# Patient Record
Sex: Male | Born: 2000 | Marital: Single | Smoking: Never smoker
Health system: Southern US, Community
[De-identification: ages and names within clinical notes are randomized; demographics above are authoritative.]

---
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
## 2020-08-25 ENCOUNTER — Other Ambulatory Visit: Payer: Self-pay

## 2020-08-25 ENCOUNTER — Encounter (HOSPITAL_COMMUNITY): Payer: Self-pay

## 2020-08-25 ENCOUNTER — Emergency Department (HOSPITAL_COMMUNITY): Payer: Medicaid Other

## 2020-08-25 ENCOUNTER — Emergency Department (HOSPITAL_COMMUNITY)
Admission: EM | Admit: 2020-08-25 | Discharge: 2020-08-25 | Disposition: A | Discharge status: Home / Self Care | Payer: Medicaid Other | Attending: Emergency Medicine | Admitting: Emergency Medicine

## 2020-08-25 DIAGNOSIS — W01198A Fall on same level from slipping, tripping and stumbling with subsequent striking against other object, initial encounter: Secondary | ICD-10-CM | POA: Diagnosis not present

## 2020-08-25 DIAGNOSIS — R000 Tachycardia, unspecified: Secondary | ICD-10-CM | POA: Diagnosis not present

## 2020-08-25 DIAGNOSIS — Y92838 Other recreation area as the place of occurrence of the external cause: Secondary | ICD-10-CM | POA: Insufficient documentation

## 2020-08-25 DIAGNOSIS — R55 Syncope and collapse: Secondary | ICD-10-CM | POA: Insufficient documentation

## 2020-08-25 DIAGNOSIS — N3000 Acute cystitis without hematuria: Secondary | ICD-10-CM | POA: Insufficient documentation

## 2020-08-25 DIAGNOSIS — S0992XA Unspecified injury of nose, initial encounter: Secondary | ICD-10-CM | POA: Diagnosis present

## 2020-08-25 DIAGNOSIS — S022XXA Fracture of nasal bones, initial encounter for closed fracture: Secondary | ICD-10-CM | POA: Insufficient documentation

## 2020-08-25 LAB — CBC WITH DIFFERENTIAL/PLATELET
Abs Immature Granulocytes: 0.04 10*3/uL (ref 0.00–0.07)
Basophils Absolute: 0.1 10*3/uL (ref 0.0–0.1)
Basophils Relative: 0 %
Eosinophils Absolute: 0 10*3/uL (ref 0.0–0.5)
Eosinophils Relative: 0 %
HCT: 44.1 % (ref 39.0–52.0)
Hemoglobin: 14.9 g/dL (ref 13.0–17.0)
Immature Granulocytes: 0 %
Lymphocytes Relative: 16 %
Lymphs Abs: 2 10*3/uL (ref 0.7–4.0)
MCH: 26.6 pg (ref 26.0–34.0)
MCHC: 33.8 g/dL (ref 30.0–36.0)
MCV: 78.8 fL — ABNORMAL LOW (ref 80.0–100.0)
Monocytes Absolute: 0.5 10*3/uL (ref 0.1–1.0)
Monocytes Relative: 4 %
Neutro Abs: 10 10*3/uL — ABNORMAL HIGH (ref 1.7–7.7)
Neutrophils Relative %: 80 %
Platelets: 367 10*3/uL (ref 150–400)
RBC: 5.6 MIL/uL (ref 4.22–5.81)
RDW: 12.3 % (ref 11.5–15.5)
WBC: 12.6 10*3/uL — ABNORMAL HIGH (ref 4.0–10.5)
nRBC: 0 % (ref 0.0–0.2)

## 2020-08-25 LAB — URINALYSIS, ROUTINE W REFLEX MICROSCOPIC
Bacteria, UA: NONE SEEN
Glucose, UA: NEGATIVE mg/dL
Hgb urine dipstick: NEGATIVE
Ketones, ur: 5 mg/dL — AB
Nitrite: NEGATIVE
Protein, ur: 100 mg/dL — AB
Specific Gravity, Urine: 1.028 (ref 1.005–1.030)
WBC, UA: >50 WBC/hpf — ABNORMAL HIGH (ref 0–5)
pH: 5 (ref 5.0–8.0)

## 2020-08-25 LAB — COMPREHENSIVE METABOLIC PANEL
ALT: 36 U/L (ref 0–44)
AST: 25 U/L (ref 15–41)
Albumin: 4.5 g/dL (ref 3.5–5.0)
Alkaline Phosphatase: 90 U/L (ref 38–126)
Anion gap: 13 (ref 5–15)
BUN: 10 mg/dL (ref 6–20)
CO2: 22 mmol/L (ref 22–32)
Calcium: 9.9 mg/dL (ref 8.9–10.3)
Chloride: 111 mmol/L (ref 98–111)
Creatinine, Ser: 0.96 mg/dL (ref 0.61–1.24)
GFR, Estimated: >60 mL/min (ref >60)
Glucose, Bld: 98 mg/dL (ref 70–99)
Potassium: 3.8 mmol/L (ref 3.5–5.1)
Sodium: 146 mmol/L — ABNORMAL HIGH (ref 135–145)
Total Bilirubin: 0.6 mg/dL (ref 0.3–1.2)
Total Protein: 8.4 g/dL — ABNORMAL HIGH (ref 6.5–8.1)

## 2020-08-25 MED ORDER — ACETAMINOPHEN 325 MG PO TABS
650.0000 mg | ORAL_TABLET | Freq: Once | ORAL | Status: AC
  Administered 2020-08-25: 650 mg via ORAL
  Filled 2020-08-25: qty 2

## 2020-08-25 MED ORDER — SODIUM CHLORIDE 0.9 % IV BOLUS
1000.0000 mL | Freq: Once | INTRAVENOUS | Status: AC
  Administered 2020-08-25: 1000 mL via INTRAVENOUS

## 2020-08-25 MED ORDER — CEPHALEXIN 500 MG PO CAPS: 500.0000 mg | ORAL_CAPSULE | Freq: Three times a day (TID) | ORAL | 0 refills | Status: AC

## 2020-08-25 NOTE — ED Triage Notes (Signed)
Syncope while on stage at event fell onto face , LOC for 1-2 minutes hx of vertigo

## 2020-08-25 NOTE — ED Provider Notes (Signed)
[facility] EMERGENCY DEPARTMENT Provider Note  CSN: [id number] Arrival date & time: 08/25/20 1901    History Chief Complaint  Patient presents with  . Loss of Consciousness    HPI  Ian Briggs is a 20 y.o. 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 He has had a sinus infection recently and thought it may have been some vertigo initially but then he started to feel lightheaded and started to find a chair to sit down when he passed out. He struck his face on the stage and is now complaining of moderate aching nose pain. He woke up after a few seconds. Denies any antecedent chest pain, palpitations or SOB. He has had a low grade fever the last few days but otherwise no cough, congestion, N/V/D, has been eating and drinking well.    History reviewed. No pertinent past medical history.  History reviewed. No pertinent surgical history.  History reviewed. No pertinent family history.  Social History   Tobacco Use  . Smoking status: Never Smoker  . Smokeless tobacco: Never Used     Home Medications Prior to Admission medications   Medication Sig Start Date End Date Taking? Authorizing Provider  cephALEXin (KEFLEX) 500 MG capsule Take 1 capsule (500 mg total) by mouth 3 (three) times daily for 7 days. 08/25/20 09/01/20 Yes Pollyann Savoy, MD  guaiFENesin (MUCINEX) 600 MG 12 hr tablet Take 600 mg by mouth 2 (two) times daily.   Yes [provider]  Multiple Vitamins-Minerals (EMERGEN-C IMMUNE) PACK Take 1 packet by mouth daily as needed (cold).   Yes [provider]     Allergies    Other   Review of Systems   Review of Systems A comprehensive review of systems was completed and negative except as noted in HPI.    Physical Exam BP (!) 150/87   Pulse 91   Temp 98.6 F (37 C) (Oral)   Resp 20   Ht 6\' 7"  (2.007 m)   Wt (!) 172.4 kg   SpO2 99%   BMI 42.81 kg/m   Physical Exam Vitals and nursing note reviewed.   Constitutional:      Appearance: Normal appearance.  HENT:     Head: Normocephalic.     Nose:     Comments: Nose is tender and swollen, there is some dried blood in L nare and small clot in R nare without active bleeding    Mouth/Throat:     Mouth: Mucous membranes are moist.     Comments: No dental fracture Eyes:     Extraocular Movements: Extraocular movements intact.     Conjunctiva/sclera: Conjunctivae normal.  Cardiovascular:     Rate and Rhythm: Regular rhythm. Tachycardia present.  Pulmonary:     Effort: Pulmonary effort is normal.     Breath sounds: Normal breath sounds.  Abdominal:     General: Abdomen is flat.     Palpations: Abdomen is soft.     Tenderness: There is no abdominal tenderness.  Musculoskeletal:        General: No swelling. Normal range of motion.     Cervical back: Neck supple. No rigidity or tenderness.  Skin:    General: Skin is warm and dry.  Neurological:     General: No focal deficit present.     Mental Status: He is alert.     Cranial Nerves: No cranial nerve deficit.     Sensory: No sensory deficit.  Motor: No weakness.  Psychiatric:        Mood and Affect: Mood normal.      ED Results / Procedures / Treatments   Labs (all labs ordered are listed, but only abnormal results are displayed) Labs Reviewed  COMPREHENSIVE METABOLIC PANEL - Abnormal; Notable for the following components:      Result Value   Sodium 146 (*)    Total Protein 8.4 (*)    All other components within normal limits  CBC WITH DIFFERENTIAL/PLATELET - Abnormal; Notable for the following components:   WBC 12.6 (*)    MCV 78.8 (*)    Neutro Abs 10.0 (*)    All other components within normal limits  URINALYSIS, ROUTINE W REFLEX MICROSCOPIC - Abnormal; Notable for the following components:   APPearance HAZY (*)    Bilirubin Urine SMALL (*)    Ketones, ur 5 (*)    Protein, ur 100 (*)    Leukocytes,Ua MODERATE (*)    WBC, UA >50 (*)    All other components  within normal limits  URINE CULTURE    EKG EKG Interpretation  Date/Time:  Friday Aug 25 2020 19:24:48 EDT Ventricular Rate:  99 PR Interval:  156 QRS Duration: 87 QT Interval:  324 QTC Calculation: 416 R Axis:   57 Text Interpretation: Sinus rhythm Borderline repolarization abnormality No old tracing to compare Confirmed by Susy Frizzle [phone]) on 08/25/2020 7:28:53 PM   Radiology CT Head Wo Contrast  Result Date: 08/25/2020 CLINICAL DATA:  Syncopal episode. Fell on face. Pain and nasal area. EXAM: CT HEAD WITHOUT CONTRAST TECHNIQUE: Contiguous axial images were obtained from the base of the skull through the vertex without intravenous contrast. COMPARISON:  None. FINDINGS: Brain: No acute infarct, hemorrhage, or mass lesion is present. The ventricles are of normal size. No significant extraaxial fluid collection is present. Vascular: No hyperdense vessel or unexpected calcification. Skull: Calvarium is intact. No focal lytic or blastic lesions are present. Minimally displaced nasal fracture present. Soft tissue injury noted. No other significant soft tissue injury present. Sinuses/Orbits: Diffuse sinus disease present. Fluid level present in the left maxillary sinus. Fluid in the posterior nasal cavity may represent blood. IMPRESSION: 1. Minimally displaced nasal fracture with associated soft tissue injury. 2. Fluid in the posterior nasal cavity may represent blood. 3. Normal CT appearance of the brain. 4. Diffuse sinus disease. Electronically Signed   By: Marin Roberts M.D.   On: 08/25/2020 20:45   CT Maxillofacial WO CM  Result Date: 08/25/2020 CLINICAL DATA:  Syncopal episode with fall.  Facial injury. EXAM: CT MAXILLOFACIAL WITHOUT CONTRAST TECHNIQUE: Multidetector CT imaging of the maxillofacial structures was performed. Multiplanar CT image reconstructions were also generated. COMPARISON:  CT head 09/12/2010 and today. Limited sinus CT 01/21/2005 FINDINGS: Osseous: Suspected  small nondisplaced nasal bone fractures bilaterally. No other evidence of facial fracture. The mandible and temporomandibular joints are intact. Orbits: The globes are intact. The optic nerves and extraocular muscles are intact. No evidence of orbital hematoma or foreign body. Sinuses: Diffuse mucosal thickening and partial opacification of the ethmoid, sphenoid and maxillary sinuses bilaterally, asymmetric to the left. There is soft tissue thickening of the nasal passages. There is a possible small air-fluid level in the left maxillary sinus. The mastoid air cells and middle ears are clear. Soft tissues: Nasal soft tissue swelling extending into the upper lip. No evidence of foreign body. Limited intracranial: Dictated separately. IMPRESSION: 1. Suspected nondisplaced bilateral nasal bone fractures with associated anterior soft tissue  swelling and diffuse paranasal sinus opacification. 2. No other facial fractures identified. 3. No evidence of orbital hematoma or globe injury. 4. Head CT findings dictated separately. Electronically Signed   By: Carey Bullocks M.D.   On: 08/25/2020 20:43    Procedures Procedures  Medications Ordered in the ED Medications  acetaminophen (TYLENOL) tablet 650 mg (has no administration in time range)  sodium chloride 0.9 % bolus 1,000 mL (0 mLs Intravenous Stopped 08/25/20 2208)     MDM Rules/Calculators/A&P MDM Patient with syncopal episode, suspect vasovagal vs orthostatic. Will check labs, send for imaging of head/face and reassess after nose cleaned for signs of laceration.  ED Course  I have reviewed the triage vital signs and the nursing notes.  Pertinent labs & imaging results that were available during my care of the patient were reviewed by me and considered in my medical decision making (see chart for details).  Clinical Course as of 08/25/20 2213  Fri Aug 25, 2020  2115 CBC with mild leukocytosis, UA shows signs of infection. Patient denies any  discharge, unprotected sex or concerns for STI. Denies any dysuria but will send for culture and treat empirically for UTI. CMP with mildly elevated sodium otherwise neg.  [CS]    Clinical Course User Index [CS] Pollyann Savoy, MD    Final Clinical Impression(s) / ED Diagnoses Final diagnoses:  Syncope, unspecified syncope type  Closed fracture of nasal bone, initial encounter  Acute cystitis without hematuria    Rx / DC Orders ED Discharge Orders         Ordered    cephALEXin (KEFLEX) 500 MG capsule  3 times daily        08/25/20 2213           Pollyann Savoy, MD 08/25/20 2213

## 2022-06-09 IMAGING — CT CT MAXILLOFACIAL W/O CM
3 series · 15 of 47 positions shown, 18 images · non-contrast
Comparison: CT head 09/12/2010 and today. Limited sinus CT
01/21/2005

CLINICAL DATA: Syncopal episode with fall.  Facial injury.

EXAM:
CT MAXILLOFACIAL WITHOUT CONTRAST
TECHNIQUE: Multidetector CT imaging of the maxillofacial structures was
performed. Multiplanar CT image reconstructions were also generated.

[Series 3: max soft · axial · 0.45mm/px · z∈[-257,-99]mm · 9 of 93 slices shown, 12 images]
[im 7/93  brain]
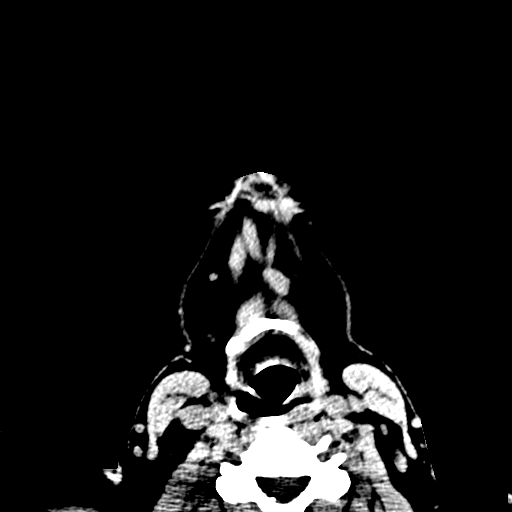
[im 7/93  bone]
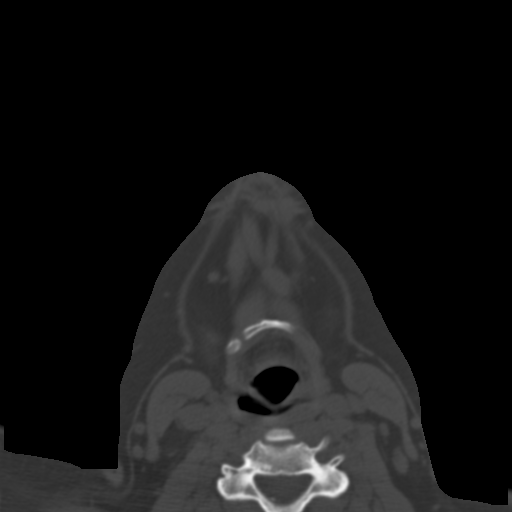
[im 16/93  bone]
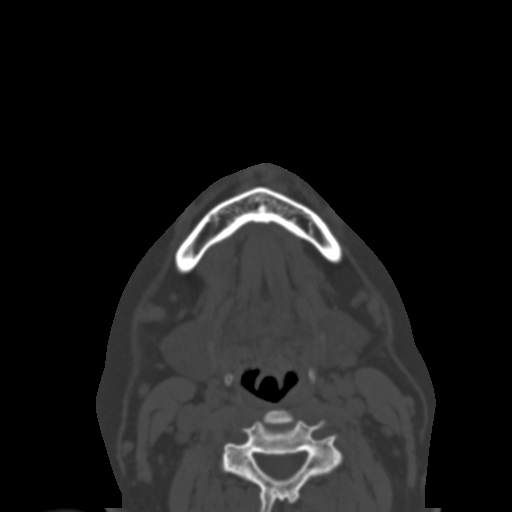
[im 26/93  bone]
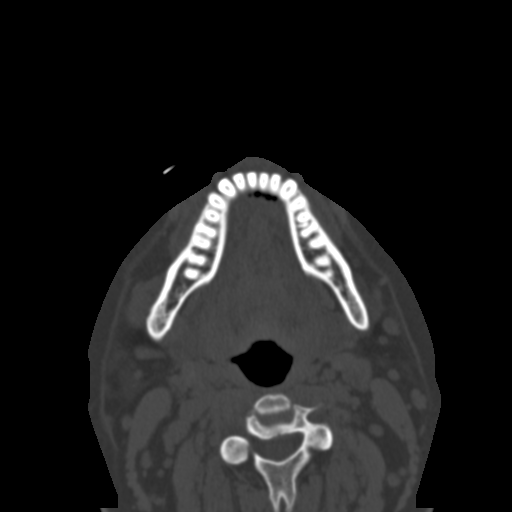
[im 35/93  bone]
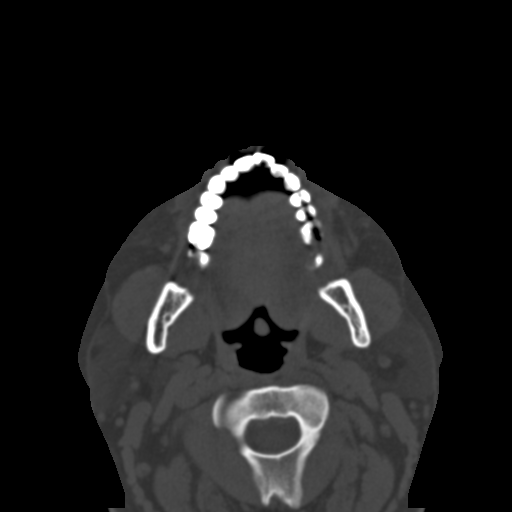
[im 48/93  brain]
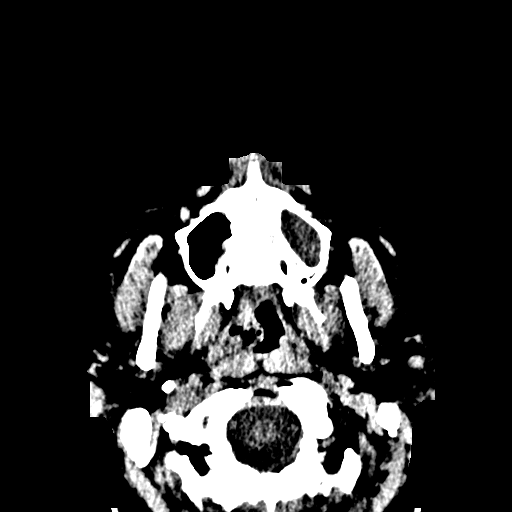
[im 48/93  bone]
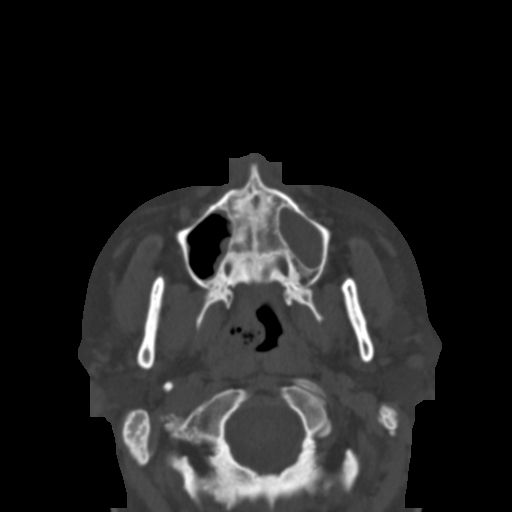
[im 58/93  bone]
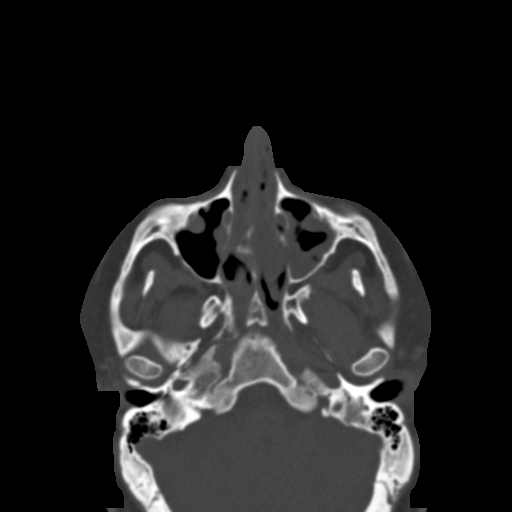
[im 67/93  bone]
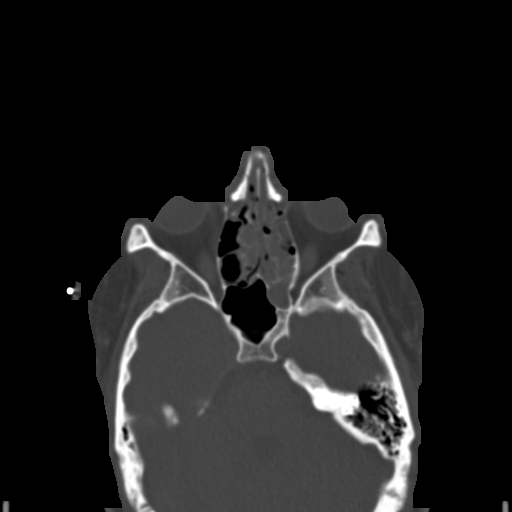
[im 77/93  bone]
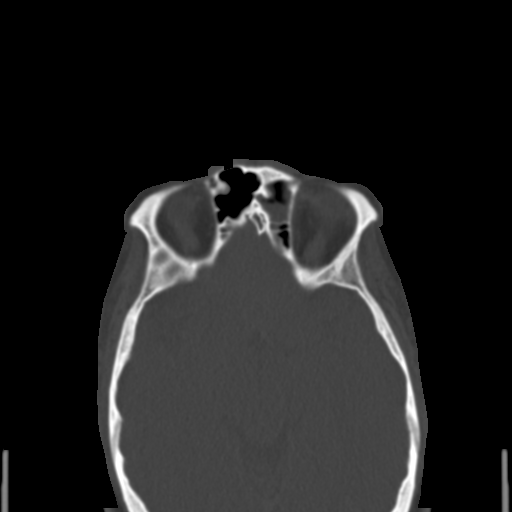
[im 86/93  brain]
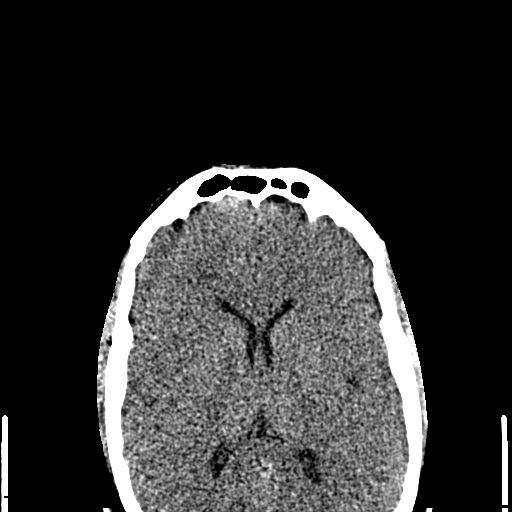
[im 86/93  bone]
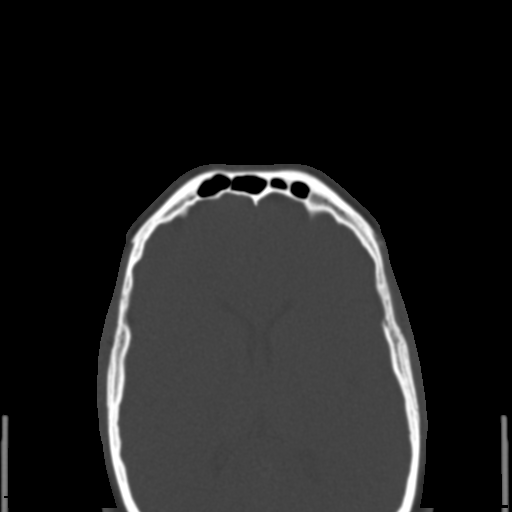

[Series 7: coronal soft · coronal · 0.40mm/px · 3 of 85 slices shown]
[im 29/85  bone]
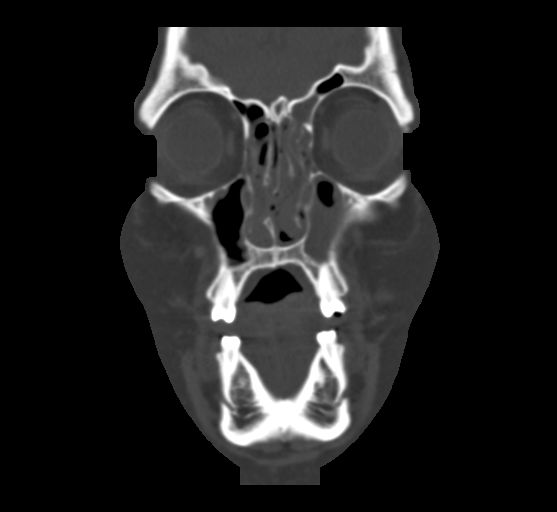
[im 38/85  bone]
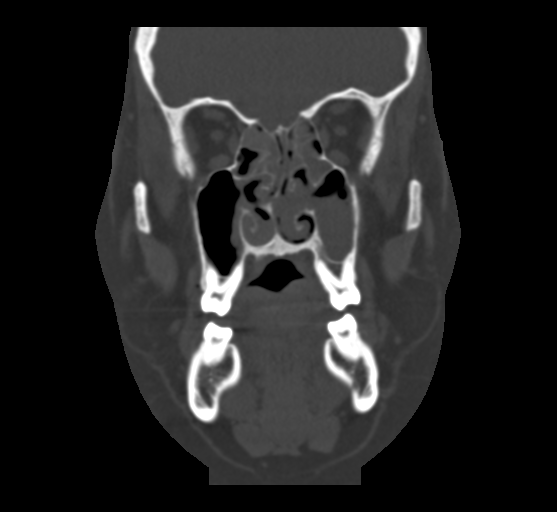
[im 47/85  bone]
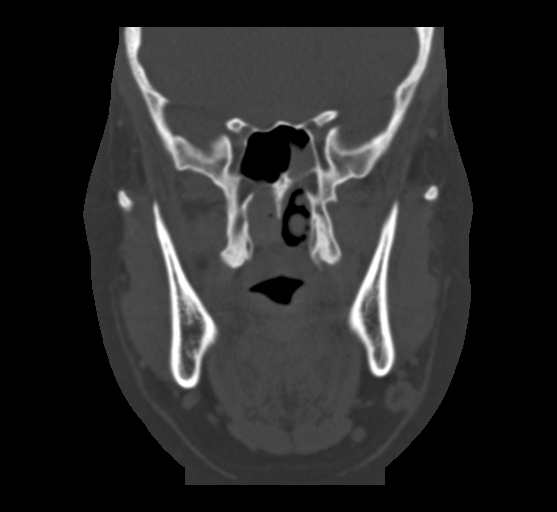

[Series 8: sagittal soft · sagittal · 0.40mm/px · 3 of 94 slices shown]
[im 32/94  bone]
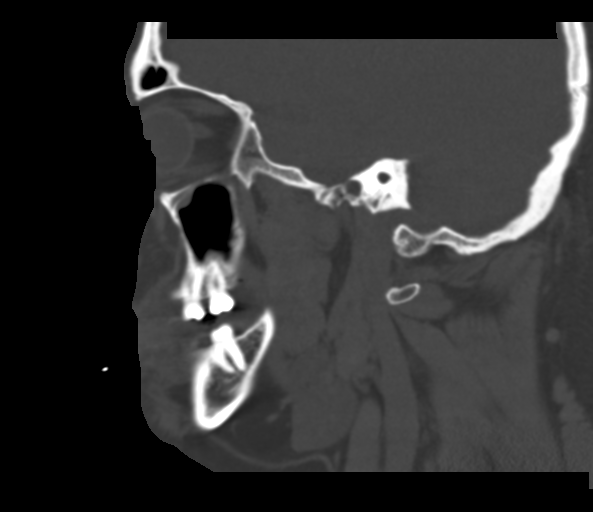
[im 47/94  bone]
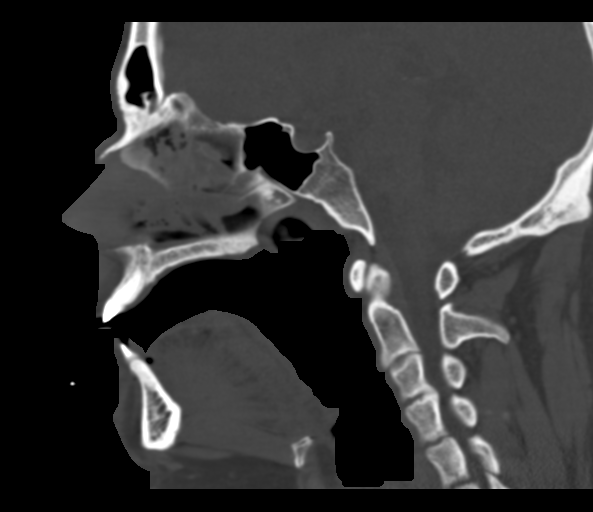
[im 63/94  bone]
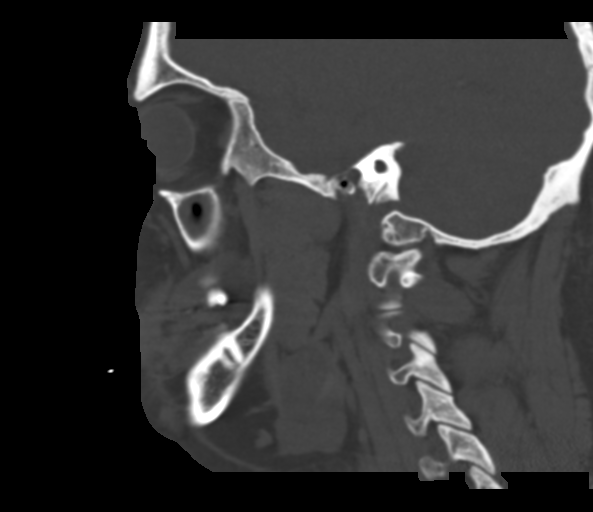

[15 of 47 positions shown; findings below may reference images not displayed]

FINDINGS: Osseous: Suspected small nondisplaced nasal bone fractures
bilaterally. No other evidence of facial fracture. The mandible and
temporomandibular joints are intact.

Orbits: The globes are intact. The optic nerves and extraocular
muscles are intact. No evidence of orbital hematoma or foreign body.

Sinuses: Diffuse mucosal thickening and partial opacification of the
ethmoid, sphenoid and maxillary sinuses bilaterally, asymmetric to
the left. There is soft tissue thickening of the nasal passages.
There is a possible small air-fluid level in the left maxillary
sinus. The mastoid air cells and middle ears are clear.

Soft tissues: Nasal soft tissue swelling extending into the upper
lip. No evidence of foreign body.

Limited intracranial: Dictated separately.
IMPRESSION: 1. Suspected nondisplaced bilateral nasal bone fractures with
associated anterior soft tissue swelling and diffuse paranasal sinus
opacification.
2. No other facial fractures identified.
3. No evidence of orbital hematoma or globe injury.
4. Head CT findings dictated separately.

## 2023-03-05 DIAGNOSIS — Z1331 Encounter for screening for depression: Secondary | ICD-10-CM | POA: Diagnosis not present

## 2023-03-05 DIAGNOSIS — E782 Mixed hyperlipidemia: Secondary | ICD-10-CM | POA: Diagnosis not present

## 2023-03-05 DIAGNOSIS — I10 Essential (primary) hypertension: Secondary | ICD-10-CM | POA: Diagnosis not present

## 2023-03-05 DIAGNOSIS — Z0000 Encounter for general adult medical examination without abnormal findings: Secondary | ICD-10-CM | POA: Diagnosis not present

## 2023-03-05 DIAGNOSIS — Z6841 Body Mass Index (BMI) 40.0 and over, adult: Secondary | ICD-10-CM | POA: Diagnosis not present

## 2023-03-05 DIAGNOSIS — R7303 Prediabetes: Secondary | ICD-10-CM | POA: Diagnosis not present

## 2023-06-09 ENCOUNTER — Ambulatory Visit: Payer: 59 | Admitting: Physician Assistant
